# Patient Record
Sex: Female | Born: 1955 | Race: White | Hispanic: No | Marital: Married | Smoking: Never smoker
Health system: Southern US, Community
[De-identification: ages and names within clinical notes are randomized; demographics above are authoritative.]

## PROBLEM LIST (undated history)

## (undated) DIAGNOSIS — E079 Disorder of thyroid, unspecified: Secondary | ICD-10-CM

## (undated) DIAGNOSIS — I7381 Erythromelalgia: Secondary | ICD-10-CM

## (undated) DIAGNOSIS — R079 Chest pain, unspecified: Secondary | ICD-10-CM

## (undated) DIAGNOSIS — B948 Sequelae of other specified infectious and parasitic diseases: Secondary | ICD-10-CM

## (undated) DIAGNOSIS — K449 Diaphragmatic hernia without obstruction or gangrene: Secondary | ICD-10-CM

## (undated) DIAGNOSIS — I7300 Raynaud's syndrome without gangrene: Secondary | ICD-10-CM

## (undated) DIAGNOSIS — M797 Fibromyalgia: Secondary | ICD-10-CM

## (undated) DIAGNOSIS — K635 Polyp of colon: Secondary | ICD-10-CM

## (undated) DIAGNOSIS — N6019 Diffuse cystic mastopathy of unspecified breast: Secondary | ICD-10-CM

## (undated) DIAGNOSIS — E063 Autoimmune thyroiditis: Secondary | ICD-10-CM

## (undated) DIAGNOSIS — R5383 Other fatigue: Secondary | ICD-10-CM

## (undated) DIAGNOSIS — F410 Panic disorder [episodic paroxysmal anxiety] without agoraphobia: Secondary | ICD-10-CM

## (undated) DIAGNOSIS — D649 Anemia, unspecified: Secondary | ICD-10-CM

## (undated) DIAGNOSIS — K317 Polyp of stomach and duodenum: Secondary | ICD-10-CM

## (undated) DIAGNOSIS — R011 Cardiac murmur, unspecified: Secondary | ICD-10-CM

## (undated) HISTORY — PX: DILATION AND CURETTAGE OF UTERUS: SHX78

## (undated) HISTORY — PX: ABDOMINAL HYSTERECTOMY: SHX81

## (undated) HISTORY — PX: APPENDECTOMY: SHX54

## (undated) HISTORY — PX: DENTAL TRAUMA REPAIR (TOOTH REIMPLANTATION): SHX1450

## (undated) HISTORY — PX: ABDOMINAL SURGERY: SHX537

## (undated) HISTORY — PX: BOWEL RESECTION: SHX1257

## (undated) HISTORY — PX: TONSILLECTOMY: SUR1361

## (undated) HISTORY — PX: HERNIA REPAIR: SHX51

## (undated) HISTORY — PX: CHOLECYSTECTOMY: SHX55

## (undated) HISTORY — PX: KNEE SURGERY: SHX244

---

## 2021-01-23 ENCOUNTER — Other Ambulatory Visit: Payer: Self-pay

## 2021-01-23 ENCOUNTER — Ambulatory Visit (INDEPENDENT_AMBULATORY_CARE_PROVIDER_SITE_OTHER): Payer: BC Managed Care – PPO

## 2021-01-23 ENCOUNTER — Ambulatory Visit (HOSPITAL_COMMUNITY)
Admission: RE | Admit: 2021-01-23 | Discharge: 2021-01-23 | Disposition: A | Discharge status: Home / Self Care | Payer: BC Managed Care – PPO | Source: Ambulatory Visit | Attending: Family Medicine | Admitting: Family Medicine

## 2021-01-23 ENCOUNTER — Encounter (HOSPITAL_COMMUNITY): Payer: Self-pay

## 2021-01-23 VITALS — BP 120/71 | HR 80 | Temp 98.2°F | Resp 17

## 2021-01-23 DIAGNOSIS — U071 COVID-19: Secondary | ICD-10-CM

## 2021-01-23 DIAGNOSIS — R5383 Other fatigue: Secondary | ICD-10-CM

## 2021-01-23 DIAGNOSIS — R0600 Dyspnea, unspecified: Secondary | ICD-10-CM

## 2021-01-23 DIAGNOSIS — J1282 Pneumonia due to coronavirus disease 2019: Secondary | ICD-10-CM | POA: Diagnosis not present

## 2021-01-23 DIAGNOSIS — R0602 Shortness of breath: Secondary | ICD-10-CM

## 2021-01-23 DIAGNOSIS — R079 Chest pain, unspecified: Secondary | ICD-10-CM

## 2021-01-23 HISTORY — DX: Polyp of stomach and duodenum: K31.7

## 2021-01-23 HISTORY — DX: Disorder of thyroid, unspecified: E07.9

## 2021-01-23 HISTORY — DX: Chest pain, unspecified: R07.9

## 2021-01-23 HISTORY — DX: Anemia, unspecified: D64.9

## 2021-01-23 HISTORY — DX: Erythromelalgia: I73.81

## 2021-01-23 HISTORY — DX: Polyp of colon: K63.5

## 2021-01-23 HISTORY — DX: Raynaud's syndrome without gangrene: I73.00

## 2021-01-23 HISTORY — DX: Other fatigue: R53.83

## 2021-01-23 HISTORY — DX: Panic disorder (episodic paroxysmal anxiety): F41.0

## 2021-01-23 HISTORY — DX: Diffuse cystic mastopathy of unspecified breast: N60.19

## 2021-01-23 HISTORY — DX: Autoimmune thyroiditis: E06.3

## 2021-01-23 HISTORY — DX: Sequelae of other specified infectious and parasitic diseases: B94.8

## 2021-01-23 HISTORY — DX: Cardiac murmur, unspecified: R01.1

## 2021-01-23 HISTORY — DX: Fibromyalgia: M79.7

## 2021-01-23 HISTORY — DX: Diaphragmatic hernia without obstruction or gangrene: K44.9

## 2021-01-23 NOTE — ED Triage Notes (Signed)
Pt presents covid + as of Sunday 01/21/2021  with shortness of breath and fatigue.

## 2021-01-23 NOTE — ED Provider Notes (Signed)
[facility]   [id number] 01/23/21 Arrival Time: 0931  ASSESSMENT & PLAN:  1. COVID-19 virus infection   2. SOB (shortness of breath)   3. Fatigue, unspecified type   4. Chest pain, unspecified type     Symptoms likely related to current COVID infection. VSS. Normal SpO2. I have personally viewed the imaging studies ordered this visit. CXR: No signs of infection. No pneumothorax. ECG: NSR. No STEMI. She is comfortable with home observation and symptomatic tx.  Agrees to ED evaluation should symptoms worsen or if she develops new and worrisome symptoms.    Follow-up Information    Schedule an appointment as soon as possible for a visit  with Aliene Beams, MD.   Specialty: Family Medicine Contact information: [address] [place] [address] [phone]               Reviewed expectations re: course of current medical issues. Questions answered. Outlined signs and symptoms indicating need for more acute intervention. Understanding verbalized. After Visit Summary given.   SUBJECTIVE: History from: patient. Sheryl Greene is a 65 y.o. female who reports testing + for COVID 2 d ago at outside facility. Reports significant fatigue with occas SOB and occas sharp anterior CP. H/O atypical CP that has been worked up by cardiologist [place] of state. Dies report occasional SOB worse with exertion. CP is random and lasts 5-10 m with gradual resolution. Has NTG at home and has used a few times with CP.CP last evening resolved without NTG. No assoc n/v/diaphoresis. Cough is present; mildly productive with "a mucous plug". No wheezing.  Social History   Tobacco Use  Smoking Status Never Smoker  Smokeless Tobacco Not on file   Social History   Substance and Sexual Activity  Alcohol Use None    OBJECTIVE:  Vitals:   01/23/21 1027  BP: 120/71  Pulse: 80  Resp: 17  Temp: 98.2 F (36.8 C)  TempSrc: Oral  SpO2: 99%    General appearance: alert; no  distress but appears fatigued Eyes: PERRLA; EOMI; conjunctiva normal HENT: [place]; AT; with nasal congestion Neck: supple  Lungs: speaks full sentences without difficulty; unlabored; CTAB; slight cough Extremities: no edema Skin: warm and dry Neurologic: normal gait Psychological: alert and cooperative; normal mood and affect  Imaging: DG Chest 2 View  Result Date: 01/23/2021 CLINICAL DATA:  COVID pneumonia, dyspnea EXAM: CHEST - 2 VIEW COMPARISON:  None. FINDINGS: The heart size and mediastinal contours are within normal limits. Both lungs are clear. The visualized skeletal structures are unremarkable. IMPRESSION: No active cardiopulmonary disease. Electronically Signed   By: Helyn Numbers MD   On: 01/23/2021 10:42    Allergies  Allergen Reactions  . Augmentin [Amoxicillin-Pot Clavulanate]   . Azithromycin   . Bactrim [Sulfamethoxazole-Trimethoprim]   . Ceclor [Cefaclor]   . Ciprofloxacin   . Compazine [Prochlorperazine]   . Eggs Or Egg-Derived Products   . Gluten Meal   . Imodium [Loperamide]   . Latex   . Levofloxacin   . Milk-Related Compounds   . Sulfa Antibiotics     Past Medical History:  Diagnosis Date  . Anemia   . Chest pain   . Colon polyp   . Erythromelalgia (HCC)   . Fatigue   . Fibrocystic breast   . Fibromyalgia   . Gastric polyp   . Hashimoto's disease   . Heart murmur   . Hiatal hernia   . Panic attacks   . Post-Lyme disease syndrome   .  Raynaud disease   . Thyroid disease    Social History   Socioeconomic History  . Marital status: Married    Spouse name: Not on file  . Number of children: Not on file  . Years of education: Not on file  . Highest education level: Not on file  Occupational History  . Not on file  Tobacco Use  . Smoking status: Never Smoker  . Smokeless tobacco: Not on file  Substance and Sexual Activity  . Alcohol use: Not on file  . Drug use: Not on file  . Sexual activity: Not on file  Other Topics Concern  . Not on  file  Social History Narrative  . Not on file   Social Determinants of Health   Financial Resource Strain: Not on file  Food Insecurity: Not on file  Transportation Needs: Not on file  Physical Activity: Not on file  Stress: Not on file  Social Connections: Not on file  Intimate Partner Violence: Not on file   Family History  Family history unknown: Yes   Past Surgical History:  Procedure Laterality Date  . ABDOMINAL HYSTERECTOMY    . ABDOMINAL SURGERY    . APPENDECTOMY    . BOWEL RESECTION    . CESAREAN SECTION    . CHOLECYSTECTOMY    . DENTAL TRAUMA REPAIR (TOOTH REIMPLANTATION)    . DILATION AND CURETTAGE OF UTERUS    . HERNIA REPAIR    . KNEE SURGERY    . Greig Right, MD 01/23/21 [phone]

## 2021-03-17 ENCOUNTER — Encounter (HOSPITAL_COMMUNITY): Payer: Self-pay | Admitting: *Deleted

## 2021-03-17 ENCOUNTER — Ambulatory Visit (HOSPITAL_COMMUNITY)
Admission: EM | Admit: 2021-03-17 | Discharge: 2021-03-17 | Disposition: A | Discharge status: Home / Self Care | Payer: BC Managed Care – PPO | Attending: Emergency Medicine | Admitting: Emergency Medicine

## 2021-03-17 ENCOUNTER — Other Ambulatory Visit: Payer: Self-pay

## 2021-03-17 DIAGNOSIS — B349 Viral infection, unspecified: Secondary | ICD-10-CM | POA: Diagnosis not present

## 2021-03-17 DIAGNOSIS — R112 Nausea with vomiting, unspecified: Secondary | ICD-10-CM

## 2021-03-17 MED ORDER — ONDANSETRON 4 MG PO TBDP: 4.0000 mg | ORAL_TABLET | Freq: Three times a day (TID) | ORAL | 0 refills | Status: AC | PRN

## 2021-03-17 MED ORDER — FLUTICASONE PROPIONATE 50 MCG/ACT NA SUSP: 2.0000 | Freq: Every day | NASAL | 0 refills | Status: AC

## 2021-03-17 MED ORDER — BENZONATATE 100 MG PO CAPS: 200.0000 mg | ORAL_CAPSULE | Freq: Three times a day (TID) | ORAL | 0 refills | Status: AC | PRN

## 2021-03-17 NOTE — ED Provider Notes (Signed)
[facility]    CSN: [id number] Arrival date & time: 03/17/21  1401      History   Chief Complaint Chief Complaint  Patient presents with  . Headache  . Sore Throat  . Emesis  . Ear Fullness    HPI Sheryl Greene is a 65 y.o. female.   Patient here for evaluation of headache, sore throat, bilateral ear pain, nasal congestion, and vomiting.  Reports symptoms began several days ago after traveling.  Patient has had Covid vaccinations and recent Covid infection in February.  Has taken Tylenol for pain and fever. Denies any specific alleviating or aggravating factors.  Denies any fevers, chest pain, shortness of breath, N/V/D, numbness, tingling, weakness, abdominal pain, or headaches.   ROS: As per HPI, all other pertinent ROS negative   The history is provided by the patient.    Past Medical History:  Diagnosis Date  . Anemia   . Chest pain   . Colon polyp   . Erythromelalgia (HCC)   . Fatigue   . Fibrocystic breast   . Fibromyalgia   . Gastric polyp   . Hashimoto's disease   . Heart murmur   . Hiatal hernia   . Panic attacks   . Post-Lyme disease syndrome   . Raynaud disease   . Thyroid disease     There are no problems to display for this patient.   Past Surgical History:  Procedure Laterality Date  . ABDOMINAL HYSTERECTOMY    . ABDOMINAL SURGERY    . APPENDECTOMY    . BOWEL RESECTION    . CESAREAN SECTION    . CHOLECYSTECTOMY    . DENTAL TRAUMA REPAIR (TOOTH REIMPLANTATION)    . DILATION AND CURETTAGE OF UTERUS    . HERNIA REPAIR    . KNEE SURGERY    . TONSILLECTOMY      OB History   No obstetric history on file.      Home Medications    Prior to Admission medications   Medication Sig Start Date End Date Taking? Authorizing Provider  benzonatate (TESSALON PERLES) 100 MG capsule Take 2 capsules (200 mg total) by mouth 3 (three) times daily as needed for cough. 03/17/21  Yes Ivette Loyal, NP  fluticasone (FLONASE) 50 MCG/ACT nasal  spray Place 2 sprays into both nostrils daily. 03/17/21  Yes Ivette Loyal, NP  ondansetron (ZOFRAN ODT) 4 MG disintegrating tablet Take 1 tablet (4 mg total) by mouth every 8 (eight) hours as needed for nausea or vomiting. 03/17/21  Yes Ivette Loyal, NP    Family History History reviewed. No pertinent family history.  Social History Social History   Tobacco Use  . Smoking status: Never Smoker  . Smokeless tobacco: Never Used     Allergies   Augmentin [amoxicillin-pot clavulanate], Azithromycin, Bactrim [sulfamethoxazole-trimethoprim], Ceclor [cefaclor], Ciprofloxacin, Compazine [prochlorperazine], Eggs or egg-derived products, Gluten meal, Imodium [loperamide], Latex, Levofloxacin, Milk-related compounds, and Sulfa antibiotics   Review of Systems Review of Systems  HENT: Positive for ear pain and sore throat.   Gastrointestinal: Positive for nausea and vomiting.  Neurological: Positive for headaches.     Physical Exam Triage Vital Signs ED Triage Vitals  Enc Vitals Group     BP 03/17/21 1449 127/79     Pulse Rate 03/17/21 1449 90     Resp 03/17/21 1449 18     Temp 03/17/21 1449 98.7 F (37.1 C)     Temp Source 03/17/21 1449 Oral  SpO2 03/17/21 1449 99 %     Weight --      Height --      Head Circumference --      Peak Flow --      Pain Score 03/17/21 1451 6     Pain Loc --      Pain Edu? --      Excl. in GC? --    No data found.  Updated Vital Signs BP 127/79 (BP Location: Right Arm)   Pulse 90   Temp 98.7 F (37.1 C) (Oral)   Resp 18   SpO2 99%   Visual Acuity Right Eye Distance:   Left Eye Distance:   Bilateral Distance:    Right Eye Near:   Left Eye Near:    Bilateral Near:     Physical Exam Vitals and nursing note reviewed.  Constitutional:      General: She is not in acute distress.    Appearance: Normal appearance. She is not ill-appearing, toxic-appearing or diaphoretic.  HENT:     Head: Normocephalic and atraumatic.     Right Ear:  Tympanic membrane, ear canal and external ear normal.     Left Ear: Tympanic membrane, ear canal and external ear normal.     Nose: Congestion present.     Mouth/Throat:     Mouth: Mucous membranes are moist.     Pharynx: Posterior oropharyngeal erythema present. No oropharyngeal exudate.     Tonsils: No tonsillar exudate or tonsillar abscesses. 1+ on the right. 1+ on the left.  Eyes:     Extraocular Movements: Extraocular movements intact.     Conjunctiva/sclera: Conjunctivae normal.  Cardiovascular:     Rate and Rhythm: Normal rate.     Pulses: Normal pulses.  Pulmonary:     Effort: Pulmonary effort is normal.     Breath sounds: Normal breath sounds.     Comments: Cough mild Abdominal:     General: Abdomen is flat.  Musculoskeletal:        General: Normal range of motion.     Cervical back: Normal range of motion.  Skin:    General: Skin is warm and dry.  Neurological:     General: No focal deficit present.     Mental Status: She is alert and oriented to person, place, and time.  Psychiatric:        Mood and Affect: Mood normal.      UC Treatments / Results  Labs (all labs ordered are listed, but only abnormal results are displayed) Labs Reviewed - No data to display  EKG   Radiology No results found.  Procedures Procedures (including critical care time)  Medications Ordered in UC Medications - No data to display  Initial Impression / Assessment and Plan / UC Course  I have reviewed the triage vital signs and the nursing notes.  Pertinent labs & imaging results that were available during my care of the patient were reviewed by me and considered in my medical decision making (see chart for details).     Viral illness, nausea with vomiting Assessment negative for red flags or concerns, most likely viral illness OTC cold medications with decongestant such as Mucinex for symptom relief Flonase 2 sprays in each nostril daily for nasal congestion Tessalon  Perles prescribed as needed for cough Zofran ODT prescribed as needed for nausea and vomiting Tylenol ibuprofen as needed Discussed symptom management techniques including hot teas, popsicles, salt water gargle, and throat sprays and lozenges Encourage fluids  and rest Follow-up with primary care  Final Clinical Impressions(s) / UC Diagnoses   Final diagnoses:  Viral illness  Non-intractable vomiting with nausea, unspecified vomiting type     Discharge Instructions     You most likely have viral illness.  Use the Flonase 2 sprays in each nostril daily.  You can use the Tessalon perles as needed for cough.   You can use the Zofran as needed for nausea and vomiting.   You may also use Ibuprofen/Tylenol as needed for fever reduction and pain relief.  Warm beverages, including hot teas and hot water with lemon, can help to relieve throat pain.  You can also gargle salt water and use throat sprays/lozenges.    Utilize a humidifier.  Rest and drink lots of fluids.   Return or go to the Emergency Department if symptoms worsen or do not improve in the next few days.       ED Prescriptions    Medication Sig Dispense Auth. Provider   ondansetron (ZOFRAN ODT) 4 MG disintegrating tablet Take 1 tablet (4 mg total) by mouth every 8 (eight) hours as needed for nausea or vomiting. 20 tablet Ivette Loyal, NP   benzonatate (TESSALON PERLES) 100 MG capsule Take 2 capsules (200 mg total) by mouth 3 (three) times daily as needed for cough. 20 capsule Ivette Loyal, NP   fluticasone (FLONASE) 50 MCG/ACT nasal spray Place 2 sprays into both nostrils daily. 18.2 mL Ivette Loyal, NP     PDMP not reviewed this encounter.   Ivette Loyal, NP 03/17/21 [phone]

## 2021-03-17 NOTE — Discharge Instructions (Addendum)
You most likely have viral illness.  Use the Flonase 2 sprays in each nostril daily.  You can use the Tessalon perles as needed for cough.   You can use the Zofran as needed for nausea and vomiting.   You may also use Ibuprofen/Tylenol as needed for fever reduction and pain relief.  Warm beverages, including hot teas and hot water with lemon, can help to relieve throat pain.  You can also gargle salt water and use throat sprays/lozenges.    Utilize a humidifier.  Rest and drink lots of fluids.   Return or go to the Emergency Department if symptoms worsen or do not improve in the next few days.

## 2021-03-17 NOTE — ED Triage Notes (Signed)
PT reports multiple Sx;s Ha.Sore throat ,Vomiting , Generalized body chills and Pain. Ear pressure both .

## 2022-08-09 ENCOUNTER — Other Ambulatory Visit: Payer: Self-pay | Admitting: Family Medicine

## 2022-08-09 DIAGNOSIS — R222 Localized swelling, mass and lump, trunk: Secondary | ICD-10-CM

## 2022-09-02 IMAGING — DX DG CHEST 2V
2 series · 2 of 2 positions shown · non-contrast
Comparison: None.

CLINICAL DATA: COVID pneumonia, dyspnea

EXAM:
CHEST - 2 VIEW

[chest pa]
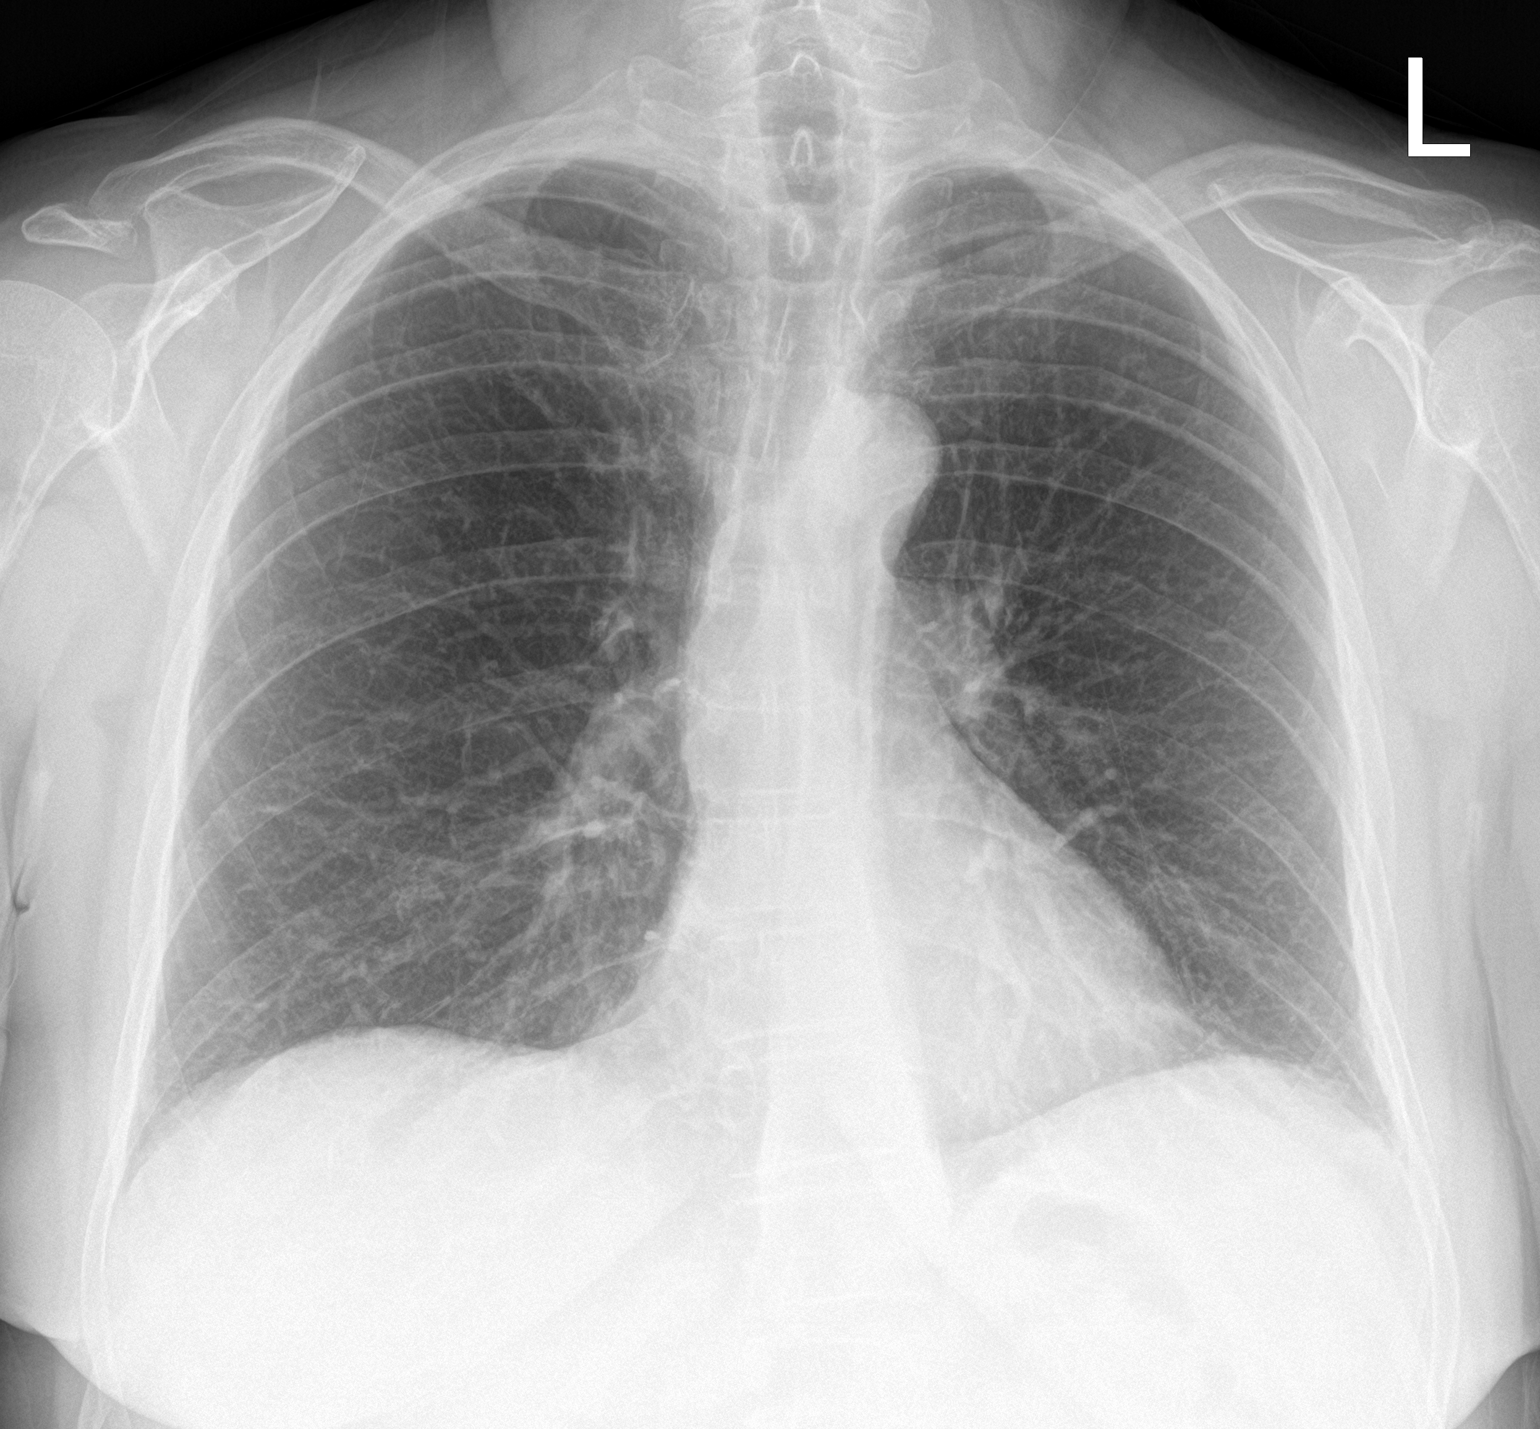

[chest lat]
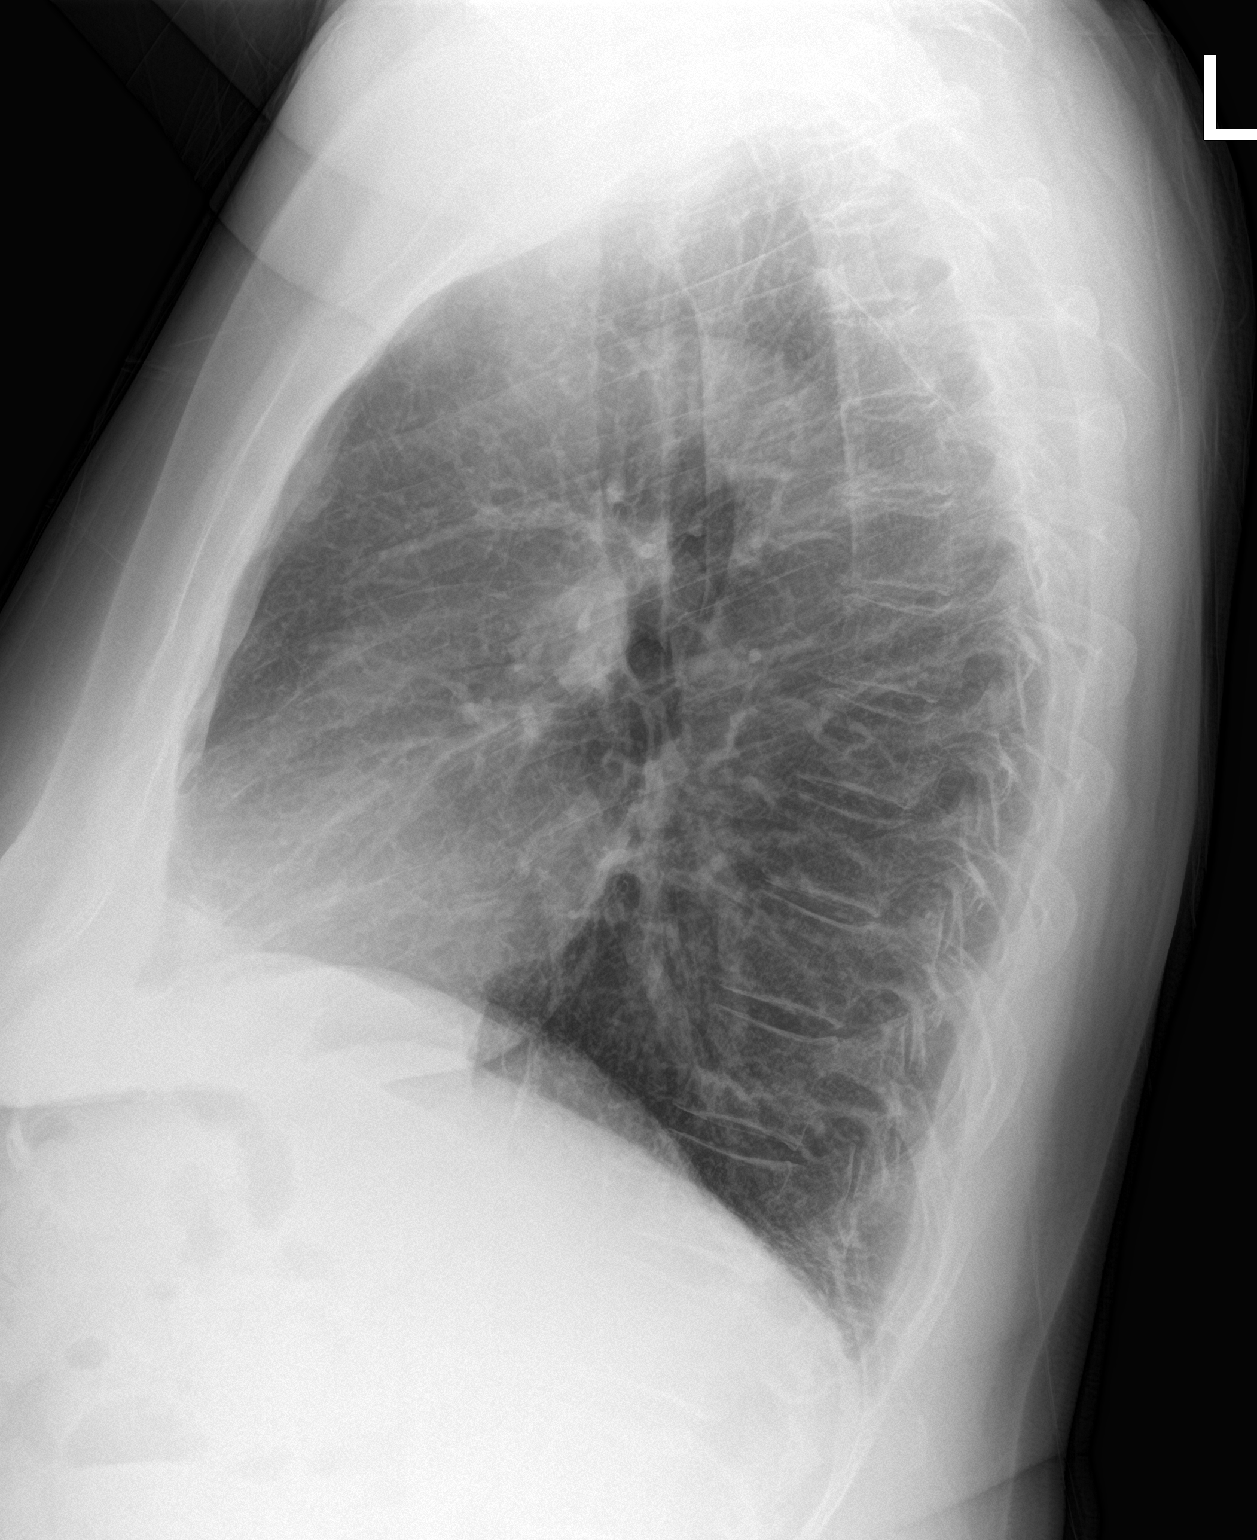

[2 of 2 positions shown; findings below may reference images not displayed]

FINDINGS: The heart size and mediastinal contours are within normal limits.
Both lungs are clear. The visualized skeletal structures are
unremarkable.
IMPRESSION: No active cardiopulmonary disease.

## 2023-03-11 ENCOUNTER — Ambulatory Visit
Admission: RE | Admit: 2023-03-11 | Discharge: 2023-03-11 | Disposition: A | Discharge status: Home / Self Care | Payer: Medicare Other | Source: Ambulatory Visit | Attending: Sports Medicine | Admitting: Sports Medicine

## 2023-03-11 ENCOUNTER — Other Ambulatory Visit: Payer: Self-pay | Admitting: Sports Medicine

## 2023-03-11 DIAGNOSIS — M25561 Pain in right knee: Secondary | ICD-10-CM

## 2023-05-06 ENCOUNTER — Other Ambulatory Visit: Payer: Self-pay | Admitting: Family Medicine

## 2023-05-06 DIAGNOSIS — R2242 Localized swelling, mass and lump, left lower limb: Secondary | ICD-10-CM

## 2023-05-07 ENCOUNTER — Ambulatory Visit
Admission: RE | Admit: 2023-05-07 | Discharge: 2023-05-07 | Disposition: A | Discharge status: Home / Self Care | Payer: Medicare Other | Source: Ambulatory Visit | Attending: Family Medicine | Admitting: Family Medicine

## 2023-05-07 DIAGNOSIS — R2242 Localized swelling, mass and lump, left lower limb: Secondary | ICD-10-CM

## 2023-05-15 ENCOUNTER — Encounter: Payer: Self-pay | Admitting: Family Medicine

## 2023-05-16 ENCOUNTER — Other Ambulatory Visit: Payer: Self-pay | Admitting: Family Medicine

## 2023-05-16 DIAGNOSIS — R2242 Localized swelling, mass and lump, left lower limb: Secondary | ICD-10-CM

## 2024-09-01 ENCOUNTER — Other Ambulatory Visit (HOSPITAL_COMMUNITY): Payer: Self-pay | Admitting: Family Medicine

## 2024-09-01 DIAGNOSIS — R519 Headache, unspecified: Secondary | ICD-10-CM

## 2024-09-03 ENCOUNTER — Ambulatory Visit (HOSPITAL_COMMUNITY)
Admission: RE | Admit: 2024-09-03 | Discharge: 2024-09-03 | Disposition: A | Discharge status: Home / Self Care | Source: Ambulatory Visit | Attending: Family Medicine | Admitting: Family Medicine

## 2024-09-03 ENCOUNTER — Ambulatory Visit: Admitting: Podiatry

## 2024-09-03 ENCOUNTER — Encounter: Payer: Self-pay | Admitting: Podiatry

## 2024-09-03 DIAGNOSIS — M778 Other enthesopathies, not elsewhere classified: Secondary | ICD-10-CM

## 2024-09-03 DIAGNOSIS — L600 Ingrowing nail: Secondary | ICD-10-CM | POA: Diagnosis not present

## 2024-09-03 DIAGNOSIS — L84 Corns and callosities: Secondary | ICD-10-CM | POA: Diagnosis not present

## 2024-09-03 DIAGNOSIS — R519 Headache, unspecified: Secondary | ICD-10-CM | POA: Diagnosis present

## 2024-09-03 MED ORDER — CEPHALEXIN 500 MG PO CAPS: 500.0000 mg | ORAL_CAPSULE | Freq: Two times a day (BID) | ORAL | 0 refills | Status: AC

## 2024-09-03 MED ORDER — GADOBUTROL 1 MMOL/ML IV SOLN
9.0000 mL | Freq: Once | INTRAVENOUS | Status: AC | PRN
  Administered 2024-09-03: 9 mL via INTRAVENOUS

## 2024-09-03 NOTE — Addendum Note (Signed)
 Addended by: CHRISTINE NORLEEN LABOR on: 09/03/2024 10:10 AM   Modules accepted: Level of Service

## 2024-09-03 NOTE — Progress Notes (Addendum)
 Patient complains of painful ingrown lateral border(s) hallux right.  Has had problems with for several years and keeps them to big the border out.  Gets red and sometimes a drainage.  Also has a complaint of a injury to the fifth toe left about 6 weeks ago.  It still painful and the source of the toe is bent out she had to replace it into position.  Says still sore but not as much as when she first did it.. Patient denies fevers, chills, nausea, vomiting.  Objective:  Vitals: Reviewed  General: Well developed, nourished, in no acute distress, alert and oriented x3   Vascular: DP pulse 2/4 bilateral. PT pulse 2/4 bilateral.  Mild edema hallux right.  Capillary refill time immediate bilaterally  Dermatology: Erythema, edema, incurvated nail border lateral border hallux right with no drainage . Tenderness present with palpation. Normal skin tone and texture feet with normal hair growth.  Neurological: Grossly intact. Normal reflexes.   Musculoskeletal: Tenderness with palpation of the distal hallux right. No tenderness or painful ROM at IPJ.  Tenderness at the fifth toe along the proximal phalanx and proximal inner phalangeal joint left  Diagnosis: 1.  Capsulitis fifth toe left 2.  Ingrown nail lateral border hallux right  Plan: -New patient office visit for evaluation and management level 3.  Modifier 25. - Discussed the fifth toe.  Given the discrepancy also he might of had a dislocation or fracture when she injured it.  On her next visit we will get some x-rays to better evaluate this.  She can ice it as needed.  Wear good shoes that do not put any pressure on the toe -discussed etiology and treatment of ingrown nails. Discussed surgical vs conservative treatment. -Consent signed for appropriate matrixectomy affected nail(s). -Rx: Keflex  500 mg 2 p.o. twice daily for 7 days  Procedure(s):   - Matrixectomy(s) lateral border hallux nail right: Toe anesthetized with 3cc 2:1 mixture 2%  Lidocaine with epinephrine: Sodium Bicarbonate. Surgical site prepped. Digital tourniquet applied.  Avulsion of nail border. performed. Matrixecomy performed with three 30 second applications of phenol to nail matrix. Site irrigated with alcohol.  Tourniquet released with good vascularity noticed in digit.  Applied triple antibiotic to nailbed and applied gauze and Coban dressing. - Written and oral postoperative instructions given.  -Return for post-op 2 weeks.  And radiographs 3 views left foot  J Prentice Binder, DPM

## 2024-09-03 NOTE — Addendum Note (Signed)
 Addended by: GERRIT ANDREZ CROME on: 09/03/2024 10:38 AM   Modules accepted: Orders

## 2024-09-03 NOTE — Patient Instructions (Signed)

## 2024-09-09 ENCOUNTER — Ambulatory Visit (INDEPENDENT_AMBULATORY_CARE_PROVIDER_SITE_OTHER): Admitting: Podiatry

## 2024-09-09 ENCOUNTER — Ambulatory Visit (INDEPENDENT_AMBULATORY_CARE_PROVIDER_SITE_OTHER)

## 2024-09-09 DIAGNOSIS — M778 Other enthesopathies, not elsewhere classified: Secondary | ICD-10-CM

## 2024-09-09 DIAGNOSIS — M2042 Other hammer toe(s) (acquired), left foot: Secondary | ICD-10-CM

## 2024-09-09 NOTE — Progress Notes (Signed)
 Patient presents today follow-up nail surgery hallux right also for follow-up on the persistent pain and swelling on the fifth toe left.  She injured it several months ago and has been swollen and sore since then.  Also a week ago she bumped her second toe it has been painful.  Has not noticed any ecchymosis or erythema on the second toe.   Physical exam:  General appearance: Pleasant, and in no acute distress. AOx3.  Vascular: Pedal pulses: DP 2/4 bilaterally, PT 2/4 bilaterally.  Mild to moderate edema fifth toe left. Capillary fill time immediate 1 through 5 bilaterally.  Neurological: [place] intact bilaterally  Dermatologic:   Skin normal temperature bilaterally.  Skin normal color, tone, and texture bilaterally. Nail surgery site healed well hallux right with no signs of infection  Musculoskeletal: Tenderness to palpation of the proximal phalanx of the fifth toe left.  Some tenderness over the DIPJ dorsally on the second toe left.  No tenderness with range of motion of the DIPJ.   Radiographs: 3 views foot left: Healed transverse fracture on proximal phalanx of fifth toe.  There is good bony callus with good consolidation of bone.  Second toe has no evidence of any fractures or dislocations.  Hammertoes 2 through 5 left.  Diagnosis: 1.  Capsulitis interphalangeal joint second toe left. 2.  Hammertoes 2 through 5 left  Plan: -Established office visit for evaluation and management level 3. - Discussed with her that some of the pain that she is having in the fifth toe which is from the fracture which appears to be healing well.  I discussed the second toe probably just a contusion with inflammation of the inner phalangeal joints.  She can ice this and wear good protective shoe should be fine if she does continue have problems with that she can call us .  Nail surgery site is healed fine with no signs of infection   Return as needed
